# Patient Record
Sex: Female | Born: 1965 | Race: White | Hispanic: No | Marital: Single | Smoking: Former smoker
Health system: Southern US, Community
[De-identification: ages and names within clinical notes are randomized; demographics above are authoritative.]

## PROBLEM LIST (undated history)

## (undated) DIAGNOSIS — K219 Gastro-esophageal reflux disease without esophagitis: Secondary | ICD-10-CM

## (undated) DIAGNOSIS — T4145XA Adverse effect of unspecified anesthetic, initial encounter: Secondary | ICD-10-CM

## (undated) DIAGNOSIS — F329 Major depressive disorder, single episode, unspecified: Secondary | ICD-10-CM

## (undated) DIAGNOSIS — I10 Essential (primary) hypertension: Secondary | ICD-10-CM

## (undated) DIAGNOSIS — T8859XA Other complications of anesthesia, initial encounter: Secondary | ICD-10-CM

## (undated) DIAGNOSIS — F32A Depression, unspecified: Secondary | ICD-10-CM

## (undated) DIAGNOSIS — Z460 Encounter for fitting and adjustment of spectacles and contact lenses: Secondary | ICD-10-CM

## (undated) HISTORY — PX: COLONOSCOPY: SHX174

## (undated) HISTORY — PX: ORIF WRIST FRACTURE: SHX2133

## (undated) HISTORY — PX: FOOT NEUROMA SURGERY: SHX646

## (undated) HISTORY — PX: WISDOM TOOTH EXTRACTION: SHX21

## (undated) HISTORY — PX: APPENDECTOMY: SHX54

---

## 2007-07-31 HISTORY — PX: KNEE ARTHROSCOPY: SUR90

## 2008-07-30 HISTORY — PX: SHOULDER ARTHROSCOPY W/ ROTATOR CUFF REPAIR: SHX2400

## 2011-09-17 ENCOUNTER — Emergency Department (HOSPITAL_COMMUNITY)
Admission: EM | Admit: 2011-09-17 | Discharge: 2011-09-17 | Disposition: A | Discharge status: Home / Self Care | Payer: BC Managed Care – PPO | Attending: Emergency Medicine | Admitting: Emergency Medicine

## 2011-09-17 ENCOUNTER — Encounter (HOSPITAL_COMMUNITY): Payer: Self-pay | Admitting: *Deleted

## 2011-09-17 ENCOUNTER — Other Ambulatory Visit: Payer: Self-pay

## 2011-09-17 ENCOUNTER — Emergency Department (HOSPITAL_COMMUNITY): Payer: BC Managed Care – PPO

## 2011-09-17 DIAGNOSIS — Z79899 Other long term (current) drug therapy: Secondary | ICD-10-CM | POA: Insufficient documentation

## 2011-09-17 DIAGNOSIS — R55 Syncope and collapse: Secondary | ICD-10-CM | POA: Insufficient documentation

## 2011-09-17 DIAGNOSIS — I959 Hypotension, unspecified: Secondary | ICD-10-CM | POA: Insufficient documentation

## 2011-09-17 DIAGNOSIS — N390 Urinary tract infection, site not specified: Secondary | ICD-10-CM

## 2011-09-17 DIAGNOSIS — R079 Chest pain, unspecified: Secondary | ICD-10-CM | POA: Insufficient documentation

## 2011-09-17 DIAGNOSIS — K219 Gastro-esophageal reflux disease without esophagitis: Secondary | ICD-10-CM | POA: Insufficient documentation

## 2011-09-17 HISTORY — DX: Essential (primary) hypertension: I10

## 2011-09-17 HISTORY — DX: Gastro-esophageal reflux disease without esophagitis: K21.9

## 2011-09-17 HISTORY — DX: Depression, unspecified: F32.A

## 2011-09-17 HISTORY — DX: Major depressive disorder, single episode, unspecified: F32.9

## 2011-09-17 LAB — URINALYSIS, ROUTINE W REFLEX MICROSCOPIC
Ketones, ur: NEGATIVE mg/dL
Leukocytes, UA: NEGATIVE
Protein, ur: NEGATIVE mg/dL
Urobilinogen, UA: 0.2 mg/dL (ref 0.0–1.0)

## 2011-09-17 LAB — DIFFERENTIAL
Basophils Absolute: 0 10*3/uL (ref 0.0–0.1)
Basophils Relative: 0 % (ref 0–1)
Eosinophils Absolute: 0.2 10*3/uL (ref 0.0–0.7)
Eosinophils Relative: 1 % (ref 0–5)
Lymphs Abs: 1.6 10*3/uL (ref 0.7–4.0)
Neutrophils Relative %: 84 % — ABNORMAL HIGH (ref 43–77)

## 2011-09-17 LAB — POCT I-STAT, CHEM 8
Calcium, Ion: 1.18 mmol/L (ref 1.12–1.32)
Creatinine, Ser: 0.8 mg/dL (ref 0.50–1.10)
Glucose, Bld: 110 mg/dL — ABNORMAL HIGH (ref 70–99)
HCT: 35 % — ABNORMAL LOW (ref 36.0–46.0)
Hemoglobin: 11.9 g/dL — ABNORMAL LOW (ref 12.0–15.0)
Potassium: 3.9 mEq/L (ref 3.5–5.1)

## 2011-09-17 LAB — CBC
MCH: 27.6 pg (ref 26.0–34.0)
MCHC: 33 g/dL (ref 30.0–36.0)
MCV: 83.6 fL (ref 78.0–100.0)
Platelets: 367 10*3/uL (ref 150–400)
RBC: 4.02 MIL/uL (ref 3.87–5.11)
RDW: 13 % (ref 11.5–15.5)

## 2011-09-17 LAB — URINE MICROSCOPIC-ADD ON

## 2011-09-17 LAB — PREGNANCY, URINE: Preg Test, Ur: NEGATIVE

## 2011-09-17 MED ORDER — NITROFURANTOIN MONOHYD MACRO 100 MG PO CAPS: 100.0000 mg | ORAL_CAPSULE | Freq: Two times a day (BID) | ORAL | Status: AC

## 2011-09-17 MED ORDER — SODIUM CHLORIDE 0.9 % IV BOLUS (SEPSIS)
1000.0000 mL | Freq: Once | INTRAVENOUS | Status: AC
  Administered 2011-09-17: 1000 mL via INTRAVENOUS

## 2011-09-17 NOTE — ED Notes (Signed)
MD at bedside. 

## 2011-09-17 NOTE — ED Notes (Signed)
Pt ambulates to restroom without difficulty. Denies dizziness at this time

## 2011-09-17 NOTE — ED Notes (Signed)
Per ems, pt received 324mg  asa enroute

## 2011-09-17 NOTE — Discharge Instructions (Signed)
Near-Syncope Near-syncope is sudden weakness, dizziness, or feeling like you might pass out (faint). This may occur when getting up after sitting or while standing for a long period of time. Near-syncope can be caused by a drop in blood pressure. This is a common reaction, but it may occur to a greater degree in people taking medicines to control their blood pressure. Fainting often occurs when the blood pressure or pulse is too low to provide enough blood flow to the brain to keep you conscious. Fainting and near-syncope are not usually due to serious medical problems. However, certain people should be more cautious in the event of near-syncope, including elderly patients, patients with diabetes, and patients with a history of heart conditions (especially irregular rhythms).  CAUSES   Drop in blood pressure.   Physical pain.   Dehydration.   Heat exhaustion.   Emotional distress.   Low blood sugar.   Internal bleeding.   Heart and circulatory problems.   Infections.  SYMPTOMS   Dizziness.   Feeling sick to your stomach (nauseous).   Nearly fainting.   Body numbness.   Turning pale.   Tunnel vision.   Weakness.  HOME CARE INSTRUCTIONS   Lie down right away if you start feeling like you might faint. Breathe deeply and steadily. Wait until all the symptoms have passed. Most of these episodes last only a few minutes. You may feel tired for several hours.   Drink enough fluids to keep your urine clear or pale yellow.   If you are taking blood pressure or heart medicine, get up slowly, taking several minutes to sit and then stand. This can reduce dizziness that is caused by a drop in blood pressure.  SEEK IMMEDIATE MEDICAL CARE IF:   You have a severe headache.   Unusual pain develops in the chest, abdomen, or back.   There is bleeding from the mouth or rectum, or you have black or tarry stool.   An irregular heartbeat or a very rapid pulse develops.   You have  repeated fainting or seizure-like jerking during an episode.   You faint when sitting or lying down.   You develop confusion.   You have difficulty walking.   Severe weakness develops.   Vision problems develop.  MAKE SURE YOU:   Understand these instructions.   Will watch your condition.   Will get help right away if you are not doing well or get worse.  Document Released: 07/16/2005 Document Revised: 03/28/2011 Document Reviewed: 09/01/2010 [facility] Patient Information 2012 [place], [place].Urinary Tract Infection Infections of the urinary tract can start in several places. A bladder infection (cystitis), a kidney infection (pyelonephritis), and a prostate infection (prostatitis) are different types of urinary tract infections (UTIs). They usually get better if treated with medicines (antibiotics) that kill germs. Take all the medicine until it is gone. You or your child may feel better in a few days, but TAKE ALL MEDICINE or the infection may not respond and may become more difficult to treat. HOME CARE INSTRUCTIONS   Drink enough water and fluids to keep the urine clear or pale yellow. Cranberry juice is especially recommended, in addition to large amounts of water.   Avoid caffeine, tea, and carbonated beverages. They tend to irritate the bladder.   Alcohol may irritate the prostate.   Only take over-the-counter or prescription medicines for pain, discomfort, or fever as directed by your caregiver.  To prevent further infections:  Empty the bladder often. Avoid holding urine for long  periods of time.   After a bowel movement, women should cleanse from front to back. Use each tissue only once.   Empty the bladder before and after sexual intercourse.  FINDING OUT THE RESULTS OF YOUR TEST Not all test results are available during your visit. If your or your child's test results are not back during the visit, make an appointment with your caregiver to find out the results. Do  not assume everything is normal if you have not heard from your caregiver or the medical facility. It is important for you to follow up on all test results. SEEK MEDICAL CARE IF:   There is back pain.   Your baby is older than 3 months with a rectal temperature of 100.5 F (38.1 C) or higher for more than 1 day.   Your or your child's problems (symptoms) are no better in 3 days. Return sooner if you or your child is getting worse.  SEEK IMMEDIATE MEDICAL CARE IF:   There is severe back pain or lower abdominal pain.   You or your child develops chills.   You have a fever.   Your baby is older than 3 months with a rectal temperature of 102 F (38.9 C) or higher.   Your baby is 68 months old or younger with a rectal temperature of 100.4 F (38 C) or higher.   There is nausea or vomiting.   There is continued burning or discomfort with urination.  MAKE SURE YOU:   Understand these instructions.   Will watch your condition.   Will get help right away if you are not doing well or get worse.  Document Released: 04/25/2005 Document Revised: 03/28/2011 Document Reviewed: 11/28/2006 [facility] Patient Information 2012 [place], [place].

## 2011-09-17 NOTE — ED Notes (Addendum)
Pt arrives via ambulance. Pt began feeling dizzy and nauseous. Laid flat and began experiencing chest pressure and tingling in left arm. Per ems, pt hypotensive with weak peripheral pulses enroute enroute.

## 2011-09-17 NOTE — ED Provider Notes (Signed)
History     CSN: [id number]  Arrival date & time 09/17/11  1236   First MD Initiated Contact with Patient 09/17/11 1243      Chief Complaint  Patient presents with  . Hypotension  . Dizziness  . Chest Pain    (Consider location/radiation/quality/duration/timing/severity/associated sxs/prior treatment) Patient is a 46 y.o. female presenting with chest pain. The history is provided by the patient.  Chest Pain Primary symptoms include nausea and dizziness. Pertinent negatives for primary symptoms include no shortness of breath, no abdominal pain and no vomiting.  Dizziness also occurs with nausea. Dizziness does not occur with vomiting or weakness.  Pertinent negatives for associated symptoms include no numbness and no weakness.    patient states that she was sitting at work at her desk and began to feel lightheaded. She states that she felt like she was nauseous and that she was going to pass out. She is a little bit of chest pressure at the time. She went to the floor sit down indication fall. She still feels a little weak. She's reportedly hypotensive for EMS. She's been doing well the last few days. Patient feels somewhat better now. She states she's currently on her period no actual vomiting. She is a former smoker. She has no known cardiac history  Past Medical History  Diagnosis Date  . Hypertension   . GERD (gastroesophageal reflux disease)   . Depression     Past Surgical History  Procedure Date  . Appendectomy   . Other surgical history     right shoulder, left wrist, right knee surgeries    No family history on file.  History  Substance Use Topics  . Smoking status: Former Smoker    Quit date: 07/30/2005  . Smokeless tobacco: Not on file  . Alcohol Use: Yes    OB History    Grav Para Term Preterm Abortions TAB SAB Ect Mult Living                  Review of Systems  Constitutional: Negative for activity change and appetite change.  HENT: Negative for  neck stiffness.   Eyes: Negative for pain.  Respiratory: Negative for chest tightness and shortness of breath.   Cardiovascular: Positive for chest pain. Negative for leg swelling.  Gastrointestinal: Positive for nausea. Negative for vomiting, abdominal pain and diarrhea.  Genitourinary: Negative for flank pain.  Musculoskeletal: Negative for back pain.  Skin: Negative for rash.  Neurological: Positive for dizziness and light-headedness. Negative for weakness, numbness and headaches.  Psychiatric/Behavioral: Negative for behavioral problems.    Allergies  Penicillins and Sulfa antibiotics  Home Medications   Current Outpatient Rx  Name Route Sig Dispense Refill  . CITALOPRAM HYDROBROMIDE 20 MG PO TABS Oral Take 20 mg by mouth daily.    Marland Kitchen HYDROCODONE-ACETAMINOPHEN 10-500 MG PO TABS Oral Take 1 tablet by mouth every 8 (eight) hours as needed. For pain    . LOSARTAN POTASSIUM 100 MG PO TABS Oral Take 100 mg by mouth daily.    Marland Kitchen OMEPRAZOLE 20 MG PO CPDR Oral Take 20 mg by mouth daily.    . SUMATRIPTAN SUCCINATE 100 MG PO TABS Oral Take 100 mg by mouth every 2 (two) hours as needed. For migraines    . NITROFURANTOIN MONOHYD MACRO 100 MG PO CAPS Oral Take 1 capsule (100 mg total) by mouth 2 (two) times daily. 10 capsule 0    BP 124/73  Pulse 69  Temp(Src) 98.1 F (36.7 C) (Oral)  Resp 19  Ht 5\' 7"  (1.702 m)  Wt 210 lb (95.255 kg)  BMI 32.89 kg/m2  SpO2 100%  LMP 09/14/2011  Physical Exam  Nursing note and vitals reviewed. Constitutional: She is oriented to person, place, and time. She appears well-developed and well-nourished.  HENT:  Head: Normocephalic and atraumatic.  Eyes: EOM are normal. Pupils are equal, round, and reactive to light.  Neck: Normal range of motion. Neck supple.  Cardiovascular: Normal rate, regular rhythm and normal heart sounds.   No murmur heard. Pulmonary/Chest: Effort normal and breath sounds normal. No respiratory distress. She has no wheezes. She  has no rales.  Abdominal: Soft. Bowel sounds are normal. She exhibits no distension. There is no tenderness. There is no rebound and no guarding.  Musculoskeletal: Normal range of motion.  Neurological: She is alert and oriented to person, place, and time. No cranial nerve deficit.  Skin: Skin is warm.       Patient's lips look somewhat pale   Psychiatric: She has a normal mood and affect. Her speech is normal.    ED Course  Procedures (including critical care time)  Labs Reviewed  CBC - Abnormal; Notable for the following:    WBC 14.4 (*)    Hemoglobin 11.1 (*)    HCT 33.6 (*)    All other components within normal limits  DIFFERENTIAL - Abnormal; Notable for the following:    Neutrophils Relative 84 (*)    Neutro Abs 12.1 (*)    Lymphocytes Relative 11 (*)    All other components within normal limits  URINALYSIS, ROUTINE W REFLEX MICROSCOPIC - Abnormal; Notable for the following:    Hgb urine dipstick LARGE (*)    All other components within normal limits  POCT I-STAT, CHEM 8 - Abnormal; Notable for the following:    Glucose, Bld 110 (*)    Hemoglobin 11.9 (*)    HCT 35.0 (*)    All other components within normal limits  URINE MICROSCOPIC-ADD ON - Abnormal; Notable for the following:    Squamous Epithelial / LPF MANY (*)    Bacteria, UA FEW (*)    All other components within normal limits  PREGNANCY, URINE  TROPONIN I   Dg Chest 2 View  09/17/2011  *RADIOLOGY REPORT*  Clinical Data: Syncope, shortness of breath.  CHEST - 2 VIEW  Comparison: None.  Findings: Trachea is midline.  Heart size normal.  Lungs are clear. No pleural fluid.  IMPRESSION: No acute findings.  Original Report Authenticated By: Reyes Ivan, M.D.     1. Near syncope   2. UTI (urinary tract infection)      Date: 09/17/2011  Rate: 62  Rhythm: normal sinus rhythm  QRS Axis: normal  Intervals: normal  ST/T Wave abnormalities: normal  Conduction Disutrbances:none  Narrative Interpretation:     Old EKG Reviewed: none available    MDM  Near syncope. Reportedly hypotensive for EMS. Patient feels much better now. He had some chest pain at times, but her EKG is reassuring. She will followup with cardiology as needed. She has a white count of 14 a possible urinary tract infection. She will be treated        Juliet Rude. Rubin Payor, MD 09/17/11 1453

## 2013-07-20 DIAGNOSIS — R51 Headache: Secondary | ICD-10-CM

## 2013-07-20 DIAGNOSIS — F329 Major depressive disorder, single episode, unspecified: Secondary | ICD-10-CM | POA: Insufficient documentation

## 2013-07-20 DIAGNOSIS — I10 Essential (primary) hypertension: Secondary | ICD-10-CM | POA: Insufficient documentation

## 2013-07-20 DIAGNOSIS — F32A Depression, unspecified: Secondary | ICD-10-CM | POA: Insufficient documentation

## 2013-07-20 DIAGNOSIS — E348 Other specified endocrine disorders: Secondary | ICD-10-CM | POA: Insufficient documentation

## 2013-07-20 DIAGNOSIS — G8929 Other chronic pain: Secondary | ICD-10-CM | POA: Insufficient documentation

## 2013-12-28 ENCOUNTER — Encounter (HOSPITAL_BASED_OUTPATIENT_CLINIC_OR_DEPARTMENT_OTHER)
Admission: RE | Admit: 2013-12-28 | Discharge: 2013-12-28 | Disposition: A | Discharge status: Home / Self Care | Payer: BC Managed Care – PPO | Source: Ambulatory Visit | Attending: Orthopedic Surgery | Admitting: Orthopedic Surgery

## 2013-12-28 ENCOUNTER — Encounter (HOSPITAL_BASED_OUTPATIENT_CLINIC_OR_DEPARTMENT_OTHER): Payer: Self-pay | Admitting: *Deleted

## 2013-12-28 ENCOUNTER — Other Ambulatory Visit: Payer: Self-pay | Admitting: Orthopedic Surgery

## 2013-12-28 LAB — BASIC METABOLIC PANEL
BUN: 12 mg/dL (ref 6–23)
CALCIUM: 8.6 mg/dL (ref 8.4–10.5)
CO2: 25 meq/L (ref 19–32)
CREATININE: 0.79 mg/dL (ref 0.50–1.10)
Chloride: 103 mEq/L (ref 96–112)
GFR calc Af Amer: >90 mL/min (ref >90)
GFR calc non Af Amer: >90 mL/min (ref >90)
Glucose, Bld: 82 mg/dL (ref 70–99)
Potassium: 4.3 mEq/L (ref 3.7–5.3)
Sodium: 139 mEq/L (ref 137–147)

## 2013-12-28 NOTE — Progress Notes (Signed)
To come in for [id number]  

## 2013-12-30 ENCOUNTER — Encounter (HOSPITAL_BASED_OUTPATIENT_CLINIC_OR_DEPARTMENT_OTHER): Payer: BC Managed Care – PPO | Admitting: Certified Registered"

## 2013-12-30 ENCOUNTER — Encounter (HOSPITAL_BASED_OUTPATIENT_CLINIC_OR_DEPARTMENT_OTHER): Payer: Self-pay

## 2013-12-30 ENCOUNTER — Encounter (HOSPITAL_BASED_OUTPATIENT_CLINIC_OR_DEPARTMENT_OTHER)
Admission: RE | Disposition: A | Discharge status: Home / Self Care | Payer: Self-pay | Source: Ambulatory Visit | Attending: Orthopedic Surgery

## 2013-12-30 ENCOUNTER — Ambulatory Visit (HOSPITAL_BASED_OUTPATIENT_CLINIC_OR_DEPARTMENT_OTHER)
Admission: RE | Admit: 2013-12-30 | Discharge: 2013-12-30 | Disposition: A | Discharge status: Home / Self Care | Payer: BC Managed Care – PPO | Source: Ambulatory Visit | Attending: Orthopedic Surgery | Admitting: Orthopedic Surgery

## 2013-12-30 ENCOUNTER — Ambulatory Visit (HOSPITAL_BASED_OUTPATIENT_CLINIC_OR_DEPARTMENT_OTHER): Payer: BC Managed Care – PPO | Admitting: Certified Registered"

## 2013-12-30 DIAGNOSIS — M629 Disorder of muscle, unspecified: Secondary | ICD-10-CM | POA: Insufficient documentation

## 2013-12-30 DIAGNOSIS — K219 Gastro-esophageal reflux disease without esophagitis: Secondary | ICD-10-CM | POA: Insufficient documentation

## 2013-12-30 DIAGNOSIS — M7061 Trochanteric bursitis, right hip: Secondary | ICD-10-CM

## 2013-12-30 DIAGNOSIS — I10 Essential (primary) hypertension: Secondary | ICD-10-CM | POA: Insufficient documentation

## 2013-12-30 DIAGNOSIS — Z79899 Other long term (current) drug therapy: Secondary | ICD-10-CM | POA: Insufficient documentation

## 2013-12-30 DIAGNOSIS — M2420 Disorder of ligament, unspecified site: Secondary | ICD-10-CM | POA: Insufficient documentation

## 2013-12-30 DIAGNOSIS — F329 Major depressive disorder, single episode, unspecified: Secondary | ICD-10-CM | POA: Insufficient documentation

## 2013-12-30 DIAGNOSIS — M76899 Other specified enthesopathies of unspecified lower limb, excluding foot: Secondary | ICD-10-CM | POA: Insufficient documentation

## 2013-12-30 DIAGNOSIS — F3289 Other specified depressive episodes: Secondary | ICD-10-CM | POA: Insufficient documentation

## 2013-12-30 DIAGNOSIS — Z87891 Personal history of nicotine dependence: Secondary | ICD-10-CM | POA: Insufficient documentation

## 2013-12-30 HISTORY — PX: EXCISION/RELEASE BURSA HIP: SHX5014

## 2013-12-30 HISTORY — PX: ILIOTIBIAL BAND RELEASE: SHX675

## 2013-12-30 HISTORY — DX: Other complications of anesthesia, initial encounter: T88.59XA

## 2013-12-30 HISTORY — DX: Adverse effect of unspecified anesthetic, initial encounter: T41.45XA

## 2013-12-30 HISTORY — DX: Encounter for fitting and adjustment of spectacles and contact lenses: Z46.0

## 2013-12-30 LAB — POCT HEMOGLOBIN-HEMACUE: Hemoglobin: 12.5 g/dL (ref 12.0–15.0)

## 2013-12-30 SURGERY — RELEASE, BURSA, TROCHANTERIC
Anesthesia: General | Laterality: Right

## 2013-12-30 MED ORDER — PROPOFOL 10 MG/ML IV EMUL
INTRAVENOUS | Status: AC
  Filled 2013-12-30: qty 50

## 2013-12-30 MED ORDER — OXYCODONE HCL 5 MG PO TABS
ORAL_TABLET | ORAL | Status: AC
  Filled 2013-12-30: qty 1

## 2013-12-30 MED ORDER — ONDANSETRON HCL 4 MG/2ML IJ SOLN
INTRAMUSCULAR | Status: DC | PRN
  Administered 2013-12-30: 4 mg via INTRAVENOUS

## 2013-12-30 MED ORDER — MIDAZOLAM HCL 2 MG/2ML IJ SOLN
INTRAMUSCULAR | Status: AC
  Filled 2013-12-30: qty 2

## 2013-12-30 MED ORDER — OXYCODONE-ACETAMINOPHEN 5-325 MG PO TABS: 1.0000 | ORAL_TABLET | Freq: Four times a day (QID) | ORAL | Status: DC | PRN

## 2013-12-30 MED ORDER — LACTATED RINGERS IV SOLN
INTRAVENOUS | Status: DC
Start: 2013-12-30 — End: 2013-12-30
  Administered 2013-12-30 (×2): via INTRAVENOUS

## 2013-12-30 MED ORDER — PROPOFOL 10 MG/ML IV BOLUS
INTRAVENOUS | Status: DC | PRN
  Administered 2013-12-30: 160 mg via INTRAVENOUS

## 2013-12-30 MED ORDER — OXYCODONE HCL 5 MG/5ML PO SOLN: 5.0000 mg | Freq: Once | ORAL | Status: AC | PRN

## 2013-12-30 MED ORDER — BUPIVACAINE-EPINEPHRINE 0.5% -1:200000 IJ SOLN
INTRAMUSCULAR | Status: DC | PRN
  Administered 2013-12-30: 30 mL

## 2013-12-30 MED ORDER — METOCLOPRAMIDE HCL 5 MG/ML IJ SOLN: 10.0000 mg | Freq: Once | INTRAMUSCULAR | Status: DC | PRN

## 2013-12-30 MED ORDER — FENTANYL CITRATE 0.05 MG/ML IJ SOLN: 50.0000 ug | INTRAMUSCULAR | Status: DC | PRN

## 2013-12-30 MED ORDER — BUPIVACAINE-EPINEPHRINE (PF) 0.5% -1:200000 IJ SOLN
INTRAMUSCULAR | Status: AC
  Filled 2013-12-30: qty 30

## 2013-12-30 MED ORDER — SUCCINYLCHOLINE CHLORIDE 20 MG/ML IJ SOLN
INTRAMUSCULAR | Status: AC
  Filled 2013-12-30: qty 1

## 2013-12-30 MED ORDER — LIDOCAINE HCL (PF) 1 % IJ SOLN
INTRAMUSCULAR | Status: AC
  Filled 2013-12-30: qty 30

## 2013-12-30 MED ORDER — OXYCODONE HCL 5 MG PO TABS
5.0000 mg | ORAL_TABLET | Freq: Once | ORAL | Status: AC | PRN
  Administered 2013-12-30: 5 mg via ORAL

## 2013-12-30 MED ORDER — BUPIVACAINE HCL (PF) 0.5 % IJ SOLN
INTRAMUSCULAR | Status: AC
  Filled 2013-12-30: qty 30

## 2013-12-30 MED ORDER — MIDAZOLAM HCL 2 MG/2ML IJ SOLN: 1.0000 mg | INTRAMUSCULAR | Status: DC | PRN

## 2013-12-30 MED ORDER — DEXAMETHASONE SODIUM PHOSPHATE 4 MG/ML IJ SOLN
INTRAMUSCULAR | Status: DC | PRN
  Administered 2013-12-30: 10 mg via INTRAVENOUS

## 2013-12-30 MED ORDER — LIDOCAINE HCL (CARDIAC) 20 MG/ML IV SOLN
INTRAVENOUS | Status: DC | PRN
  Administered 2013-12-30: 40 mg via INTRAVENOUS

## 2013-12-30 MED ORDER — KETOROLAC TROMETHAMINE 30 MG/ML IJ SOLN
INTRAMUSCULAR | Status: DC | PRN
  Administered 2013-12-30: 30 mg via INTRAVENOUS

## 2013-12-30 MED ORDER — ATROPINE SULFATE 0.4 MG/ML IJ SOLN
INTRAMUSCULAR | Status: DC | PRN
  Administered 2013-12-30: 0.4 mg via INTRAVENOUS

## 2013-12-30 MED ORDER — EPHEDRINE SULFATE 50 MG/ML IJ SOLN
INTRAMUSCULAR | Status: DC | PRN
  Administered 2013-12-30: 10 mg via INTRAVENOUS

## 2013-12-30 MED ORDER — METOCLOPRAMIDE HCL 5 MG/ML IJ SOLN
INTRAMUSCULAR | Status: DC | PRN
  Administered 2013-12-30: 10 mg via INTRAVENOUS

## 2013-12-30 MED ORDER — HYDROMORPHONE HCL PF 1 MG/ML IJ SOLN
INTRAMUSCULAR | Status: AC
  Filled 2013-12-30: qty 1

## 2013-12-30 MED ORDER — LIDOCAINE HCL 2 % IJ SOLN
INTRAMUSCULAR | Status: AC
  Filled 2013-12-30: qty 20

## 2013-12-30 MED ORDER — FENTANYL CITRATE 0.05 MG/ML IJ SOLN
INTRAMUSCULAR | Status: DC | PRN
  Administered 2013-12-30: 100 ug via INTRAVENOUS
  Administered 2013-12-30: 50 ug via INTRAVENOUS

## 2013-12-30 MED ORDER — CLINDAMYCIN PHOSPHATE 900 MG/50ML IV SOLN
INTRAVENOUS | Status: AC
  Filled 2013-12-30: qty 50

## 2013-12-30 MED ORDER — CLINDAMYCIN PHOSPHATE 900 MG/50ML IV SOLN: 900.0000 mg | INTRAVENOUS | Status: DC

## 2013-12-30 MED ORDER — CHLORHEXIDINE GLUCONATE 4 % EX LIQD: 60.0000 mL | Freq: Once | CUTANEOUS | Status: DC

## 2013-12-30 MED ORDER — FENTANYL CITRATE 0.05 MG/ML IJ SOLN
INTRAMUSCULAR | Status: AC
  Filled 2013-12-30: qty 6

## 2013-12-30 MED ORDER — CLINDAMYCIN PHOSPHATE 900 MG/50ML IV SOLN
INTRAVENOUS | Status: DC | PRN
  Administered 2013-12-30: 900 mg via INTRAVENOUS

## 2013-12-30 MED ORDER — HYDROMORPHONE HCL PF 1 MG/ML IJ SOLN
0.2500 mg | INTRAMUSCULAR | Status: DC | PRN
  Administered 2013-12-30: 0.5 mg via INTRAVENOUS

## 2013-12-30 SURGICAL SUPPLY — 55 items
BENZOIN TINCTURE PRP APPL 2/3 (GAUZE/BANDAGES/DRESSINGS) ×4 IMPLANT
BLADE 10 SAFETY STRL DISP (BLADE) IMPLANT
BLADE 15 SAFETY STRL DISP (BLADE) ×2 IMPLANT
CANISTER SUCT 1200ML W/VALVE (MISCELLANEOUS) ×2 IMPLANT
CHLORAPREP W/TINT 26ML (MISCELLANEOUS) ×2 IMPLANT
CLEANER CAUTERY TIP 5X5 PAD (MISCELLANEOUS) IMPLANT
COVER MAYO STAND STRL (DRAPES) IMPLANT
COVER TABLE BACK 60X90 (DRAPES) ×2 IMPLANT
DECANTER SPIKE VIAL GLASS SM (MISCELLANEOUS) IMPLANT
DRAPE INCISE IOBAN 66X45 STRL (DRAPES) ×2 IMPLANT
DRAPE LAPAROTOMY T 102X78X121 (DRAPES) IMPLANT
DRAPE U-SHAPE 47X51 STRL (DRAPES) ×4 IMPLANT
DRSG ADAPTIC 3X8 NADH LF (GAUZE/BANDAGES/DRESSINGS) ×2 IMPLANT
DRSG EMULSION OIL 3X3 NADH (GAUZE/BANDAGES/DRESSINGS) IMPLANT
DRSG PAD ABDOMINAL 8X10 ST (GAUZE/BANDAGES/DRESSINGS) ×2 IMPLANT
DURAPREP 26ML APPLICATOR (WOUND CARE) IMPLANT
ELECT REM PT RETURN 9FT ADLT (ELECTROSURGICAL) ×2
ELECTRODE REM PT RTRN 9FT ADLT (ELECTROSURGICAL) ×1 IMPLANT
GAUZE SPONGE 4X4 12PLY STRL (GAUZE/BANDAGES/DRESSINGS) IMPLANT
GLOVE BIOGEL PI IND STRL 7.0 (GLOVE) ×1 IMPLANT
GLOVE BIOGEL PI IND STRL 8 (GLOVE) ×2 IMPLANT
GLOVE BIOGEL PI INDICATOR 7.0 (GLOVE) ×1
GLOVE BIOGEL PI INDICATOR 8 (GLOVE) ×2
GLOVE ECLIPSE 6.5 STRL STRAW (GLOVE) ×2 IMPLANT
GLOVE ECLIPSE 7.5 STRL STRAW (GLOVE) ×6 IMPLANT
GLOVE EXAM NITRILE LRG STRL (GLOVE) ×2 IMPLANT
GOWN STRL REUS W/ TWL LRG LVL3 (GOWN DISPOSABLE) ×1 IMPLANT
GOWN STRL REUS W/ TWL XL LVL3 (GOWN DISPOSABLE) ×1 IMPLANT
GOWN STRL REUS W/TWL LRG LVL3 (GOWN DISPOSABLE) ×1
GOWN STRL REUS W/TWL XL LVL3 (GOWN DISPOSABLE) ×3 IMPLANT
NEEDLE HYPO 22GX1.5 SAFETY (NEEDLE) IMPLANT
NS IRRIG 1000ML POUR BTL (IV SOLUTION) ×2 IMPLANT
PACK ARTHROSCOPY DSU (CUSTOM PROCEDURE TRAY) ×2 IMPLANT
PACK BASIN DAY SURGERY FS (CUSTOM PROCEDURE TRAY) ×2 IMPLANT
PAD CLEANER CAUTERY TIP 5X5 (MISCELLANEOUS)
PENCIL BUTTON HOLSTER BLD 10FT (ELECTRODE) ×2 IMPLANT
SPONGE GAUZE 4X4 12PLY STER LF (GAUZE/BANDAGES/DRESSINGS) ×2 IMPLANT
SPONGE LAP 18X18 X RAY DECT (DISPOSABLE) ×2 IMPLANT
STAPLER VISISTAT 35W (STAPLE) IMPLANT
STRIP CLOSURE SKIN 1/2X4 (GAUZE/BANDAGES/DRESSINGS) ×2 IMPLANT
SUT BONE WAX W31G (SUTURE) ×2 IMPLANT
SUT ETHILON 4 0 PS 2 18 (SUTURE) IMPLANT
SUT MNCRL AB 3-0 PS2 18 (SUTURE) ×2 IMPLANT
SUT VIC AB 0 CT1 27 (SUTURE) ×1
SUT VIC AB 0 CT1 27XBRD ANBCTR (SUTURE) ×1 IMPLANT
SUT VIC AB 1 CT1 27 (SUTURE) ×1
SUT VIC AB 1 CT1 27XBRD ANBCTR (SUTURE) ×1 IMPLANT
SUT VIC AB 2-0 SH 27 (SUTURE) ×1
SUT VIC AB 2-0 SH 27XBRD (SUTURE) ×1 IMPLANT
SYR BULB 3OZ (MISCELLANEOUS) ×2 IMPLANT
SYR CONTROL 10ML LL (SYRINGE) IMPLANT
TOWEL OR NON WOVEN STRL DISP B (DISPOSABLE) ×2 IMPLANT
TUBE CONNECTING 20X1/4 (TUBING) ×2 IMPLANT
UNDERPAD 30X30 INCONTINENT (UNDERPADS AND DIAPERS) IMPLANT
YANKAUER SUCT BULB TIP NO VENT (SUCTIONS) ×2 IMPLANT

## 2013-12-30 NOTE — H&P (Signed)
PREOPERATIVE H&P  Chief Complaint: right hip pain  HPI: Stephanie Moon is a 48 y.o. female who presents for evaluation of right hip pain. It has been present for greater than a year and has been worsening. She has failed conservative measuresincluding physical therapy, injection therapy, and activity modification.. Pain is rated as moderate.  Past Medical History  Diagnosis Date  . Hypertension   . GERD (gastroesophageal reflux disease)   . Depression   . Contact lens/glasses fitting     wears contacts or glasses  . Complication of anesthesia     slow to wake up-o2 sat drop   Past Surgical History  Procedure Laterality Date  . Appendectomy    . Shoulder arthroscopy w/ rotator cuff repair  2010    right  . Knee arthroscopy  2009    right  . Foot neuroma surgery    . Orif wrist fracture  2001,2002    left  . Colonoscopy    . Wisdom tooth extraction     History   Social History  . Marital Status: Single    Spouse Name: N/A    Number of Children: N/A  . Years of Education: N/A   Social History Main Topics  . Smoking status: Former Smoker    Quit date: 07/30/2005  . Smokeless tobacco: None  . Alcohol Use: Yes  . Drug Use: No  . Sexual Activity: None   Other Topics Concern  . None   Social History Narrative  . None   History reviewed. No pertinent family history. Allergies  Allergen Reactions  . Penicillins Rash  . Sulfa Antibiotics Rash   Prior to Admission medications   Medication Sig Start Date End Date Taking? Authorizing Provider  citalopram (CELEXA) 20 MG tablet Take 20 mg by mouth daily.   Yes Historical Provider, MD  losartan (COZAAR) 100 MG tablet Take 100 mg by mouth daily.   Yes Historical Provider, MD  omeprazole (PRILOSEC) 20 MG capsule Take 20 mg by mouth daily.   Yes Historical Provider, MD  SUMAtriptan (IMITREX) 100 MG tablet Take 100 mg by mouth every 2 (two) hours as needed. For migraines   Yes Historical Provider, MD     Positive ROS:  none  All other systems have been reviewed and were otherwise negative with the exception of those mentioned in the HPI and as above.  Physical Exam: Filed Vitals:   12/30/13 0705  BP: 130/88  Pulse: 74  Temp: 98.5 F (36.9 C)  Resp: 20    General: Alert, no acute distress Cardiovascular: No pedal edema Respiratory: No cyanosis, no use of accessory musculature GI: No organomegaly, abdomen is soft and non-tender Skin: No lesions in the area of chief complaint Neurologic: Sensation intact distally Psychiatric: Patient is competent for consent with normal mood and affect Lymphatic: No axillary or cervical lymphadenopathy  MUSCULOSKELETAL: right hip: Markedly tender to palpation over the greater trochanter.  Pain with abduction.  Pain with rotation. X-rays show no evidence of arthritis.  MRI shows no evidence of ligament tear or avascular necrosis. Assessment/Plan: trochanteric bursitis, tight ILIAL-TIBIAL BAND  Plan for Procedure(s): EXCISION/RELEASE BURSA HIP ILIO-TIBIAL BAND RELEASE  The risks benefits and alternatives were discussed with the patient including but not limited to the risks of nonoperative treatment, versus surgical intervention including infection, bleeding, nerve injury, malunion, nonunion, hardware prominence, hardware failure, need for hardware removal, blood clots, cardiopulmonary complications, morbidity, mortality, among others, and they were willing to proceed.  Predicted outcome is good, although  there will be at least a six to nine month expected recovery.  Harvie Junior, MD 12/30/2013 8:11 AM

## 2013-12-30 NOTE — Anesthesia Postprocedure Evaluation (Signed)
Anesthesia Post Note  Patient: Stephanie Moon  Procedure(s) Performed: Procedure(s) (LRB): EXCISION/RELEASE BURSA HIP (Right) ILIO-TIBIAL BAND RELEASE (Right)  Anesthesia type: General  Patient location: PACU  Post pain: Pain level controlled  Post assessment: Patient's Cardiovascular Status Stable  Last Vitals:  Filed Vitals:   12/30/13 1053  BP:   Pulse: 90  Temp:   Resp: 18    Post vital signs: Reviewed and stable  Level of consciousness: alert  Complications: No apparent anesthesia complications

## 2013-12-30 NOTE — Anesthesia Preprocedure Evaluation (Signed)
Anesthesia Evaluation  Patient identified by MRN, date of birth, ID band Patient awake    Reviewed: Allergy & Precautions, H&P , NPO status , Patient's Chart, lab work & pertinent test results, reviewed documented beta blocker date and time   History of Anesthesia Complications (+) PROLONGED EMERGENCE and history of anesthetic complications  Airway Mallampati: II TM Distance: >3 FB Neck ROM: full    Dental   Pulmonary neg pulmonary ROS, former smoker,  breath sounds clear to auscultation        Cardiovascular hypertension, On Medications Rhythm:regular     Neuro/Psych PSYCHIATRIC DISORDERS negative neurological ROS     GI/Hepatic Neg liver ROS, GERD-  Medicated and Controlled,  Endo/Other  negative endocrine ROS  Renal/GU negative Renal ROS  negative genitourinary   Musculoskeletal   Abdominal   Peds  Hematology negative hematology ROS (+)   Anesthesia Other Findings See surgeon's H&P   Reproductive/Obstetrics negative OB ROS                           Anesthesia Physical Anesthesia Plan  ASA: II  Anesthesia Plan: General   Post-op Pain Management:    Induction: Intravenous  Airway Management Planned: LMA and Oral ETT  Additional Equipment:   Intra-op Plan:   Post-operative Plan: Extubation in OR  Informed Consent: I have reviewed the patients History and Physical, chart, labs and discussed the procedure including the risks, benefits and alternatives for the proposed anesthesia with the patient or authorized representative who has indicated his/her understanding and acceptance.   Dental Advisory Given  Plan Discussed with: CRNA and Surgeon  Anesthesia Plan Comments:         Anesthesia Quick Evaluation

## 2013-12-30 NOTE — Discharge Instructions (Signed)
Ambulate Wgt Bearing as tolerated on your right leg.  Use crutches as needed for a few days. Al Apply ice to hip x 3-4 days.    Post Anesthesia Home Care Instructions  Activity: Get plenty of rest for the remainder of the day. A responsible adult should stay with you for 24 hours following the procedure.  For the next 24 hours, DO NOT: -Drive a car -Advertising copywriter -Drink alcoholic beverages -Take any medication unless instructed by your physician -Make any legal decisions or sign important papers.  Meals: Start with liquid foods such as gelatin or soup. Progress to regular foods as tolerated. Avoid greasy, spicy, heavy foods. If nausea and/or vomiting occur, drink only clear liquids until the nausea and/or vomiting subsides. Call your physician if vomiting continues.  Special Instructions/Symptoms: Your throat may feel dry or sore from the anesthesia or the breathing tube placed in your throat during surgery. If this causes discomfort, gargle with warm salt water. The discomfort should disappear within 24 hours.   Call your surgeon if you experience:   1.  Fever over 101.0. 2.  Inability to urinate. 3.  Nausea and/or vomiting. 4.  Extreme swelling or bruising at the surgical site. 5.  Continued bleeding from the incision. 6.  Increased pain, redness or drainage from the incision. 7.  Problems related to your pain medication.

## 2013-12-30 NOTE — Anesthesia Procedure Notes (Signed)
Procedure Name: Intubation Date/Time: 12/30/2013 8:47 AM Performed by: Curly Shores Pre-anesthesia Checklist: Patient identified, Emergency Drugs available, Suction available and Patient being monitored Patient Re-evaluated:Patient Re-evaluated prior to inductionOxygen Delivery Method: Circle System Utilized Preoxygenation: Pre-oxygenation with 100% oxygen Intubation Type: IV induction Ventilation: Mask ventilation without difficulty Laryngoscope Size: Miller and 2 Grade View: Grade I Tube type: Oral Tube size: 7.0 mm Number of attempts: 1 Airway Equipment and Method: stylet Placement Confirmation: ETT inserted through vocal cords under direct vision,  positive ETCO2 and breath sounds checked- equal and bilateral Secured at: 21 cm Tube secured with: Tape Dental Injury: Teeth and Oropharynx as per pre-operative assessment

## 2013-12-30 NOTE — Transfer of Care (Signed)
Immediate Anesthesia Transfer of Care Note  Patient: Stephanie Moon  Procedure(s) Performed: Procedure(s): EXCISION/RELEASE BURSA HIP (Right) ILIO-TIBIAL BAND RELEASE (Right)  Patient Location: PACU  Anesthesia Type:General  Level of Consciousness: awake, sedated and patient cooperative  Airway & Oxygen Therapy: Patient Spontanous Breathing and Patient connected to face mask oxygen  Post-op Assessment: Report given to PACU RN, Post -op Vital signs reviewed and stable and Patient moving all extremities  Post vital signs: Reviewed and stable  Complications: No apparent anesthesia complications

## 2013-12-30 NOTE — Brief Op Note (Signed)
12/30/2013  1:07 PM  PATIENT:  Stephanie Moon  48 y.o. female  PRE-OPERATIVE DIAGNOSIS:  trochanteric bursitis, tight ILIAL-TIBIAL BAND   POST-OPERATIVE DIAGNOSIS:  trochanteric bursitis, tight ILIAL-TIBIAL BAND   PROCEDURE:  Procedure(s): EXCISION/RELEASE BURSA HIP (Right) ILIO-TIBIAL BAND RELEASE (Right)  SURGEON:  Surgeon(s) and Role:    * Harvie Junior, MD - Primary  PHYSICIAN ASSISTANT:   ASSISTANTS: bethune   ANESTHESIA:   general  EBL:  Total I/O In: 2266 [P.O.:666; I.V.:1600] Out: -   BLOOD ADMINISTERED:none  DRAINS: none   LOCAL MEDICATIONS USED:  MARCAINE     SPECIMEN:  No Specimen  DISPOSITION OF SPECIMEN:  N/A  COUNTS:  YES  TOURNIQUET:  * No tourniquets in log *  DICTATION: .Other Dictation: Dictation Number [id number]  PLAN OF CARE: Discharge to home after PACU  PATIENT DISPOSITION:  PACU - hemodynamically stable.   Delay start of Pharmacological VTE agent (>24hrs) due to surgical blood loss or risk of bleeding: no

## 2013-12-31 ENCOUNTER — Encounter (HOSPITAL_BASED_OUTPATIENT_CLINIC_OR_DEPARTMENT_OTHER): Payer: Self-pay | Admitting: Orthopedic Surgery

## 2013-12-31 NOTE — Op Note (Signed)
NAMECATTLEYA, Stephanie Moon NO.:  [id number]  MEDICAL RECORD NO.:  [id number]  LOCATION:                                 FACILITY:  PHYSICIAN:  Harvie Junior, M.D.   DATE OF BIRTH:  Apr 25, 1966  DATE OF PROCEDURE:  12/30/2013 DATE OF DISCHARGE:  12/30/2013                              OPERATIVE REPORT   PREOPERATIVE DIAGNOSIS:  Trochanteric bursitis with tight iliotibial band and a prominent greater trochanter, right.  POSTOPERATIVE DIAGNOSIS:  Trochanteric bursitis with tight iliotibial band and a prominent greater trochanter, right.  PROCEDURE: 1. Iliotibial band release, right. 2. Trochanteric bursectomy, right. 3. Partial exostectomy of greater trochanter.  SURGEON:  Harvie Junior, MD  ASSISTANT:  Marshia Ly, PA  ANESTHESIA:  General.  BRIEF HISTORY:  [name] is a 48 year old female with a history of having significant complaints of right trochanteric pain.  She was seeing Dr. Doristine Section in our office, and he had done thorough and exhaustive evaluation and treatment and ultimately felt that she needed a trochanteric bursectomy and iliotibial band release and he sent her to [place] for this procedure.  We evaluated her and concurred with his workup and felt it was appropriate, she was taken to the operating room for this procedure.  DESCRIPTION OF PROCEDURE:  The patient was taken to the operating room. After adequate anesthesia was obtained with general anesthetic, the patient was placed supine on the operating table and then moved into left lateral decubitus position.  All bony prominences were well padded. She __________ held with a bean bag.  At that point, attention was turned to the right hip where an incision was made over the subcutaneous tissue down the level of the trochanter and iliotibial band was identified and about a 2-cm longitudinal rent was made and then this was __________ anteriorly and posteriorly __________ a diagonal  direction at the distal extent of the wound and at the proximal extent of the wound. Once this was done, there was a definite release of the tightness of the iliotibial band.  There was a very significant trochanteric bursa which was encountered at this point, and a thorough bursectomy was performed. The trochanteric area was identified and the rongeur was used to do a partial exostectomy of the greater trochanter.  Once this was done, the wound was copiously and thoroughly irrigated, suctioned dry, and the deep layers were closed with 0 and 2-0 Vicryl and 3-0 Monocryl subcuticular for the skin.  Benzoin and Steri-Strips were applied.  Sterile compressive dressing was applied and the patient was taken to recovery, was noted to be in satisfactory condition.  Estimated blood loss for procedure was minimal.     Harvie Junior, M.D.     Ranae Plumber  D:  12/30/2013  T:  12/31/2013  Job:  [id number]

## 2014-05-24 DIAGNOSIS — IMO0002 Reserved for concepts with insufficient information to code with codable children: Secondary | ICD-10-CM | POA: Insufficient documentation

## 2014-08-11 DIAGNOSIS — M84374A Stress fracture, right foot, initial encounter for fracture: Secondary | ICD-10-CM | POA: Insufficient documentation

## 2014-12-16 DIAGNOSIS — E559 Vitamin D deficiency, unspecified: Secondary | ICD-10-CM | POA: Insufficient documentation

## 2015-01-03 ENCOUNTER — Other Ambulatory Visit: Payer: Self-pay | Admitting: Rehabilitation

## 2015-01-03 DIAGNOSIS — M4726 Other spondylosis with radiculopathy, lumbar region: Secondary | ICD-10-CM

## 2015-01-08 ENCOUNTER — Ambulatory Visit
Admission: RE | Admit: 2015-01-08 | Discharge: 2015-01-08 | Disposition: A | Discharge status: Home / Self Care | Payer: Self-pay | Source: Ambulatory Visit | Attending: Rehabilitation | Admitting: Rehabilitation

## 2015-01-08 DIAGNOSIS — M4726 Other spondylosis with radiculopathy, lumbar region: Secondary | ICD-10-CM

## 2015-02-01 DIAGNOSIS — I10 Essential (primary) hypertension: Secondary | ICD-10-CM | POA: Insufficient documentation

## 2015-02-01 DIAGNOSIS — R55 Syncope and collapse: Secondary | ICD-10-CM | POA: Insufficient documentation

## 2015-02-01 DIAGNOSIS — E669 Obesity, unspecified: Secondary | ICD-10-CM | POA: Insufficient documentation

## 2015-02-01 DIAGNOSIS — R600 Localized edema: Secondary | ICD-10-CM | POA: Insufficient documentation

## 2015-09-30 ENCOUNTER — Emergency Department (HOSPITAL_BASED_OUTPATIENT_CLINIC_OR_DEPARTMENT_OTHER): Payer: BLUE CROSS/BLUE SHIELD

## 2015-09-30 ENCOUNTER — Encounter (HOSPITAL_BASED_OUTPATIENT_CLINIC_OR_DEPARTMENT_OTHER): Payer: Self-pay | Admitting: *Deleted

## 2015-09-30 ENCOUNTER — Emergency Department (HOSPITAL_BASED_OUTPATIENT_CLINIC_OR_DEPARTMENT_OTHER)
Admission: EM | Admit: 2015-09-30 | Discharge: 2015-09-30 | Disposition: A | Discharge status: Home / Self Care | Payer: BLUE CROSS/BLUE SHIELD | Attending: Emergency Medicine | Admitting: Emergency Medicine

## 2015-09-30 DIAGNOSIS — Z973 Presence of spectacles and contact lenses: Secondary | ICD-10-CM | POA: Insufficient documentation

## 2015-09-30 DIAGNOSIS — F329 Major depressive disorder, single episode, unspecified: Secondary | ICD-10-CM | POA: Insufficient documentation

## 2015-09-30 DIAGNOSIS — W2209XA Striking against other stationary object, initial encounter: Secondary | ICD-10-CM | POA: Insufficient documentation

## 2015-09-30 DIAGNOSIS — Z880 Allergy status to penicillin: Secondary | ICD-10-CM | POA: Diagnosis not present

## 2015-09-30 DIAGNOSIS — Y998 Other external cause status: Secondary | ICD-10-CM | POA: Diagnosis not present

## 2015-09-30 DIAGNOSIS — S6992XA Unspecified injury of left wrist, hand and finger(s), initial encounter: Secondary | ICD-10-CM | POA: Diagnosis not present

## 2015-09-30 DIAGNOSIS — Y9389 Activity, other specified: Secondary | ICD-10-CM | POA: Insufficient documentation

## 2015-09-30 DIAGNOSIS — Z87891 Personal history of nicotine dependence: Secondary | ICD-10-CM | POA: Insufficient documentation

## 2015-09-30 DIAGNOSIS — K219 Gastro-esophageal reflux disease without esophagitis: Secondary | ICD-10-CM | POA: Insufficient documentation

## 2015-09-30 DIAGNOSIS — Y9289 Other specified places as the place of occurrence of the external cause: Secondary | ICD-10-CM | POA: Insufficient documentation

## 2015-09-30 DIAGNOSIS — I10 Essential (primary) hypertension: Secondary | ICD-10-CM | POA: Insufficient documentation

## 2015-09-30 DIAGNOSIS — Z79899 Other long term (current) drug therapy: Secondary | ICD-10-CM | POA: Diagnosis not present

## 2015-09-30 NOTE — ED Notes (Signed)
Patient transported to X-ray 

## 2015-09-30 NOTE — ED Notes (Signed)
Pt seen by PA but stated was not satisfied with her care and requested to leave. Pt agreed to sign out as a discharge but refused to wait for discharge papers.

## 2015-09-30 NOTE — ED Notes (Signed)
Pt reports her left hand was slammed against door frame while holding dog leash 1 week ago. Reports continued pain and swelling

## 2015-10-01 NOTE — ED Provider Notes (Signed)
CSN: [id number]     Arrival date & time 09/30/15  1915 History   First MD Initiated Contact with Patient 09/30/15 2048     Chief Complaint  Patient presents with  . Hand Injury     (Consider location/radiation/quality/duration/timing/severity/associated sxs/prior Treatment) HPI   Patient is a 50 year old female past medical history of hypertension, depression and GERD since the ED with complaint of left hand pain. Patient reports her left hand was slammed against the doorframe while holding her dog leash 1 week ago. She reports initially having severe pain and swelling to her left hand but notes the swelling has improved over the past week. Patient reports having continued pain in her left hand but notes it has mildly improved since initial incident. Patient reports she has been using ice at home but denies taking any medications. Denies numbness, tingling, weakness. Patient denies any other recent injury or trauma.  Past Medical History  Diagnosis Date  . Hypertension   . GERD (gastroesophageal reflux disease)   . Depression   . Contact lens/glasses fitting     wears contacts or glasses  . Complication of anesthesia     slow to wake up-o2 sat drop   Past Surgical History  Procedure Laterality Date  . Appendectomy    . Shoulder arthroscopy w/ rotator cuff repair  2010    right  . Knee arthroscopy  2009    right  . Foot neuroma surgery    . Orif wrist fracture  2001,2002    left  . Colonoscopy    . Wisdom tooth extraction    . Excision/release bursa hip Right 12/30/2013    Procedure: EXCISION/RELEASE BURSA HIP;  Surgeon: Harvie JuniorJohn L Graves, MD;  Location: [facility];  Service: Orthopedics;  Laterality: Right;  . Iliotibial band release Right 12/30/2013    Procedure: ILIO-TIBIAL BAND RELEASE;  Surgeon: Harvie JuniorJohn L Graves, MD;  Location:  SURGERY CENTER;  Service: Orthopedics;  Laterality: Right;   No family history on file. Social History  Substance Use Topics  .  Smoking status: Former Smoker    Quit date: 07/30/2005  . Smokeless tobacco: None  . Alcohol Use: Yes   OB History    No data available     Review of Systems  Constitutional: Negative for fever.  Musculoskeletal: Positive for joint swelling and arthralgias.  Skin: Negative for wound.  Neurological: Negative for weakness and numbness.      Allergies  Penicillins and Sulfa antibiotics  Home Medications   Prior to Admission medications   Medication Sig Start Date End Date Taking? Authorizing Provider  AMLODIPINE BESYLATE PO Take by mouth.   Yes Historical Provider, MD  HYDROCHLOROTHIAZIDE PO Take by mouth.   Yes Historical Provider, MD  losartan (COZAAR) 100 MG tablet Take 100 mg by mouth daily.   Yes Historical Provider, MD  omeprazole (PRILOSEC) 20 MG capsule Take 20 mg by mouth daily.   Yes Historical Provider, MD  citalopram (CELEXA) 20 MG tablet Take 20 mg by mouth daily.    Historical Provider, MD  oxyCODONE-acetaminophen (PERCOCET/ROXICET) 5-325 MG per tablet Take 1-2 tablets by mouth every 6 (six) hours as needed for severe pain. 12/30/13   Marshia LyJames Bethune, PA-C  SUMAtriptan (IMITREX) 100 MG tablet Take 100 mg by mouth every 2 (two) hours as needed. For migraines    Historical Provider, MD   BP 119/94 mmHg  Pulse 73  Temp(Src) 98.5 F (36.9 C) (Oral)  Resp 16  Ht 5\' 7"  (1.702  m)  Wt 90.719 kg  BMI 31.32 kg/m2  SpO2 99%  LMP 09/27/2015 Physical Exam  Constitutional: She is oriented to person, place, and time. She appears well-developed and well-nourished.  HENT:  Head: Normocephalic and atraumatic.  Eyes: Conjunctivae and EOM are normal. Right eye exhibits no discharge. Left eye exhibits no discharge. No scleral icterus.  Neck: Normal range of motion. Neck supple.  Cardiovascular: Normal rate.   Pulmonary/Chest: Effort normal.  Musculoskeletal: Normal range of motion. She exhibits tenderness. She exhibits no edema.       Left hand: She exhibits tenderness. She  exhibits normal range of motion, normal two-point discrimination, normal capillary refill, no deformity, no laceration and no swelling. Normal sensation noted. Normal strength noted.  Tenderness to palpation over left second and third MCP joints, full range of motion of left hand, wrist and elbow. 2+ radial pulses. Sensation intact. Cap refill less than 2. Equal grip strength bilaterally. No swelling, erythema or warmth noted.  Neurological: She is alert and oriented to person, place, and time.  Nursing note and vitals reviewed.   ED Course  Procedures (including critical care time) Labs Review Labs Reviewed - No data to display  Imaging Review Dg Hand Complete Left  09/30/2015  CLINICAL DATA:  LEFT hand injury 6 days ago, posterior surface of hand hit door frame, pain throughout second distal metacarpal and MCP joint, pain when making a fist, bruising and swelling EXAM: LEFT HAND - COMPLETE 3+ VIEW COMPARISON:  None FINDINGS: Osseous mineralization normal. Herbert screw at scaphoid post ORIF. Minimal degenerative changes at STT joint. No acute fracture, dislocation or bone destruction. Dorsal soft tissue swelling. IMPRESSION: Prior scaphoid ORIF. No acute osseous abnormalities. Electronically Signed   By: Ulyses Southward M.D.   On: 09/30/2015 19:47   I have personally reviewed and evaluated these images and lab results as part of my medical decision-making.   EKG Interpretation None      MDM   Final diagnoses:  Hand injury, left, initial encounter    Patient presents with left hand injury. She reports hitting her hand against the doorframe one week ago. VSS. Exam revealed mild tenderness over left second and third MCP joints, remaining exam unremarkable. Left hand neurovascularly intact. Left hand revealed no acute abnormalities. Plan to discharge patient home with symptomatic treatment.  I was notified by nurse that patient reports she was not satisfied with her care and requested to  leave. Nurse reports patient agreed to sign out at discharge but refused to wait for her discharge papers.     Satira Sark [place], [place] 10/01/15 1610  Alvira Monday, MD 10/01/15 1229

## 2016-07-31 ENCOUNTER — Encounter: Payer: Self-pay | Admitting: Podiatry

## 2016-07-31 ENCOUNTER — Ambulatory Visit (INDEPENDENT_AMBULATORY_CARE_PROVIDER_SITE_OTHER): Payer: Managed Care, Other (non HMO)

## 2016-07-31 ENCOUNTER — Encounter: Payer: Self-pay | Admitting: *Deleted

## 2016-07-31 ENCOUNTER — Other Ambulatory Visit: Payer: Self-pay | Admitting: *Deleted

## 2016-07-31 ENCOUNTER — Ambulatory Visit (INDEPENDENT_AMBULATORY_CARE_PROVIDER_SITE_OTHER): Payer: Managed Care, Other (non HMO) | Admitting: Podiatry

## 2016-07-31 VITALS — BP 106/69 | HR 70 | Resp 16

## 2016-07-31 DIAGNOSIS — M722 Plantar fascial fibromatosis: Secondary | ICD-10-CM

## 2016-07-31 MED ORDER — METHYLPREDNISOLONE 4 MG PO TBPK: ORAL_TABLET | ORAL | 0 refills | Status: AC

## 2016-07-31 MED ORDER — MELOXICAM 15 MG PO TABS: 15.0000 mg | ORAL_TABLET | Freq: Every day | ORAL | 3 refills | Status: DC

## 2016-07-31 NOTE — Progress Notes (Signed)
   Subjective:    Patient ID: Stephanie Moon, female    DOB: 02/13/1966, 51 y.o.   MRN: [id number]  HPI: She presents today with chief complaint of plantar heel pain bilaterally. She states that the left hurts worse than the right and has been aching for the past 2 months. Mornings are particularly bad. She works at [employer] where she is on her feet all the time. She's had this problem before. She currently wears a night splint and tried ice massage therapy over-the-counter orthotics shots seem to have helped the most in the past.    Review of Systems  Musculoskeletal: Positive for arthralgias.  All other systems reviewed and are negative.      Objective:   Physical Exam: She presents today vital signs stable alert and oriented 3. Pulses are strongly palpable. Neurologic sensorium is intact each and reflexes are intact muscle strength is normal bilateral. Orthopedic evaluation demonstrates pain on palpation medial calcaneal tubercle at bilateral. Otherwise I'll joint cyst to the ankle range of motion without crepitation. Radiographic evaluation demonstrates plantar distally oriented calcaneal heel spur right greater than left with soft tissue increase in density of the plantar fascia calcaneal insertion site left foot and right. No open lesions or wounds are noted.          Assessment & Plan:  Assessment: Plantar fasciitis left greater than right.  Plan: Start her on a Medrol Dosepak to be followed by meloxicam. Injected the bilateral heels today placed her plantar fascia braces. She will use a night splint that she already has at home. We discussed appropriate shoe gear stretching exercises ice therapy shear modifications and the need for orthotics.

## 2016-07-31 NOTE — Patient Instructions (Signed)

## 2016-08-28 ENCOUNTER — Ambulatory Visit: Payer: Managed Care, Other (non HMO) | Admitting: Podiatry

## 2016-09-10 IMAGING — CR DG HAND COMPLETE 3+V*L*
3 series · 3 of 3 positions shown · non-contrast
Comparison: None

CLINICAL DATA: LEFT hand injury 6 days ago, posterior surface of
hand hit door frame, pain throughout second distal metacarpal and
MCP joint, pain when making a fist, bruising and swelling

EXAM:
LEFT HAND - COMPLETE 3+ VIEW

[x hand pa left]
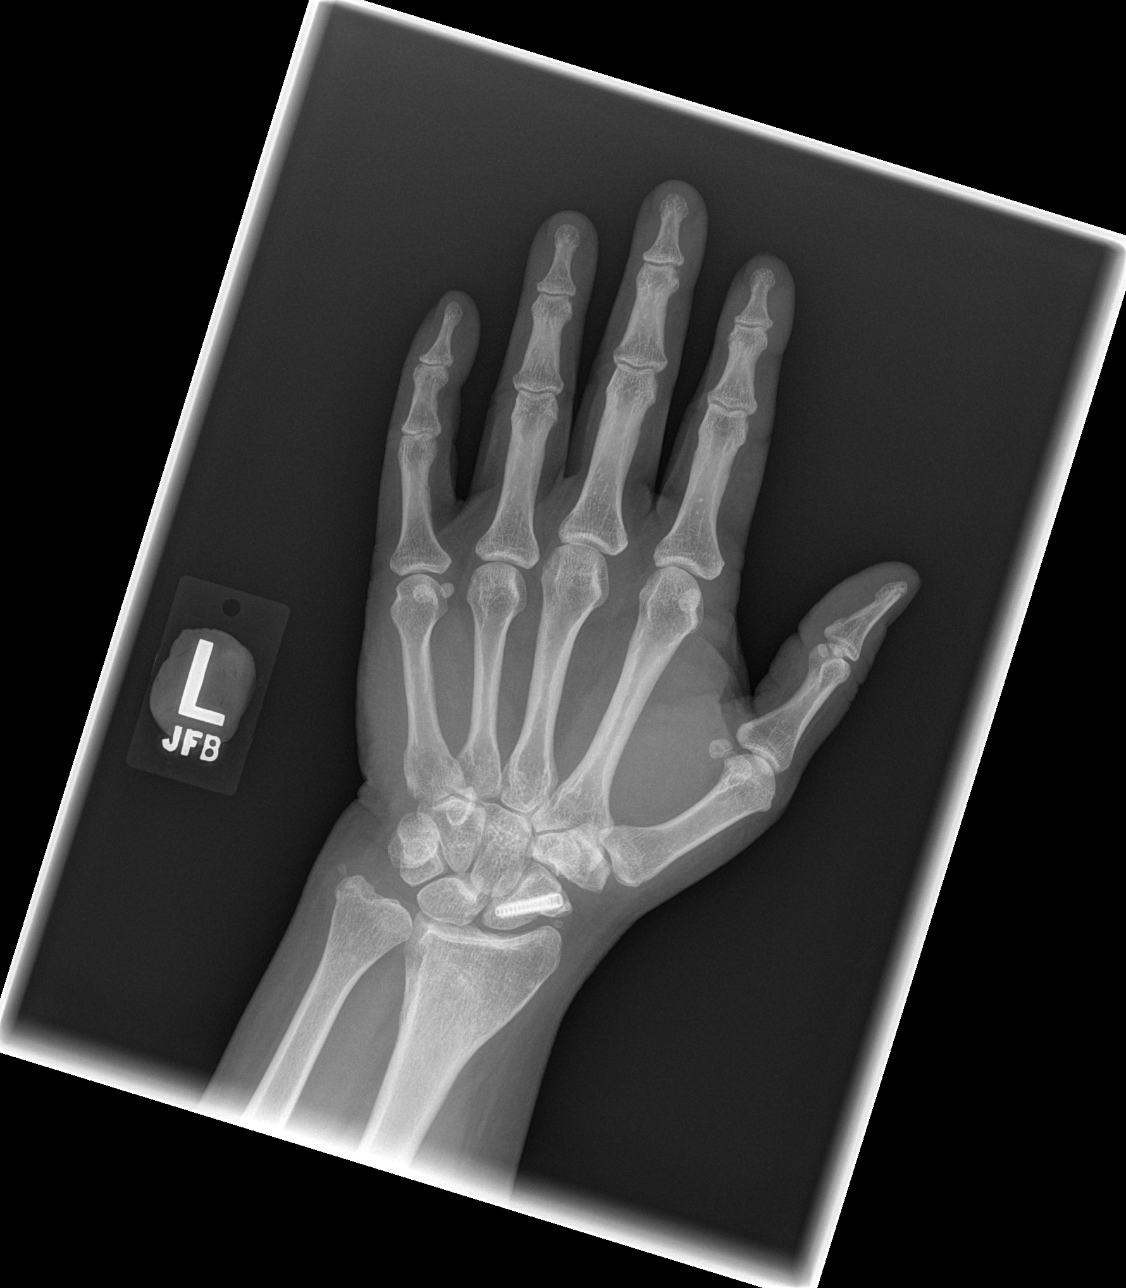

[x hand lat left]
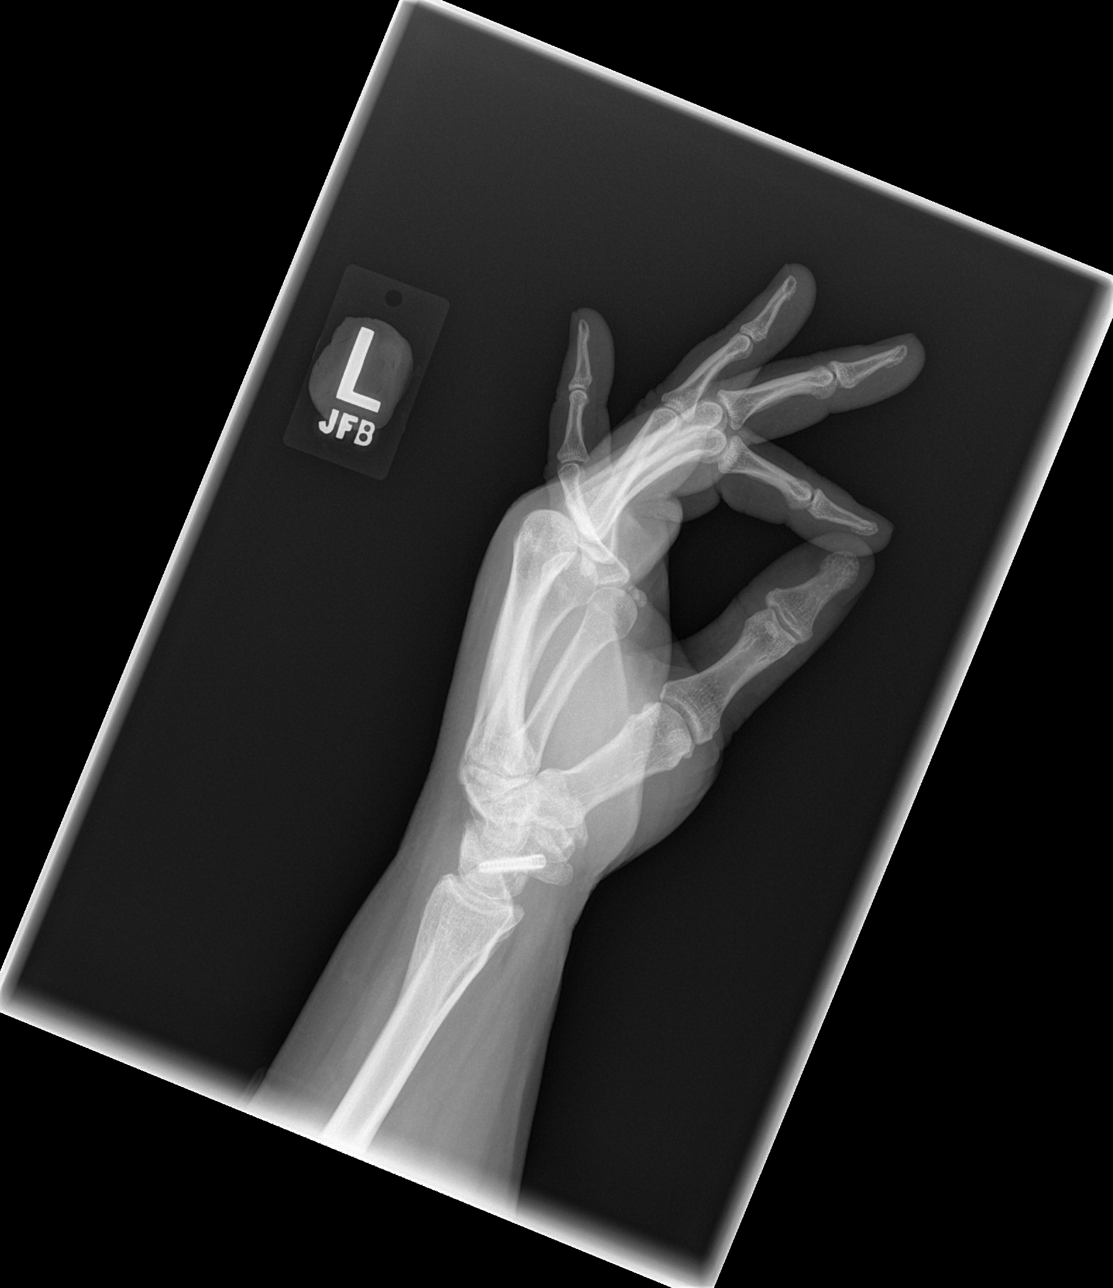

[x hand oblique left]
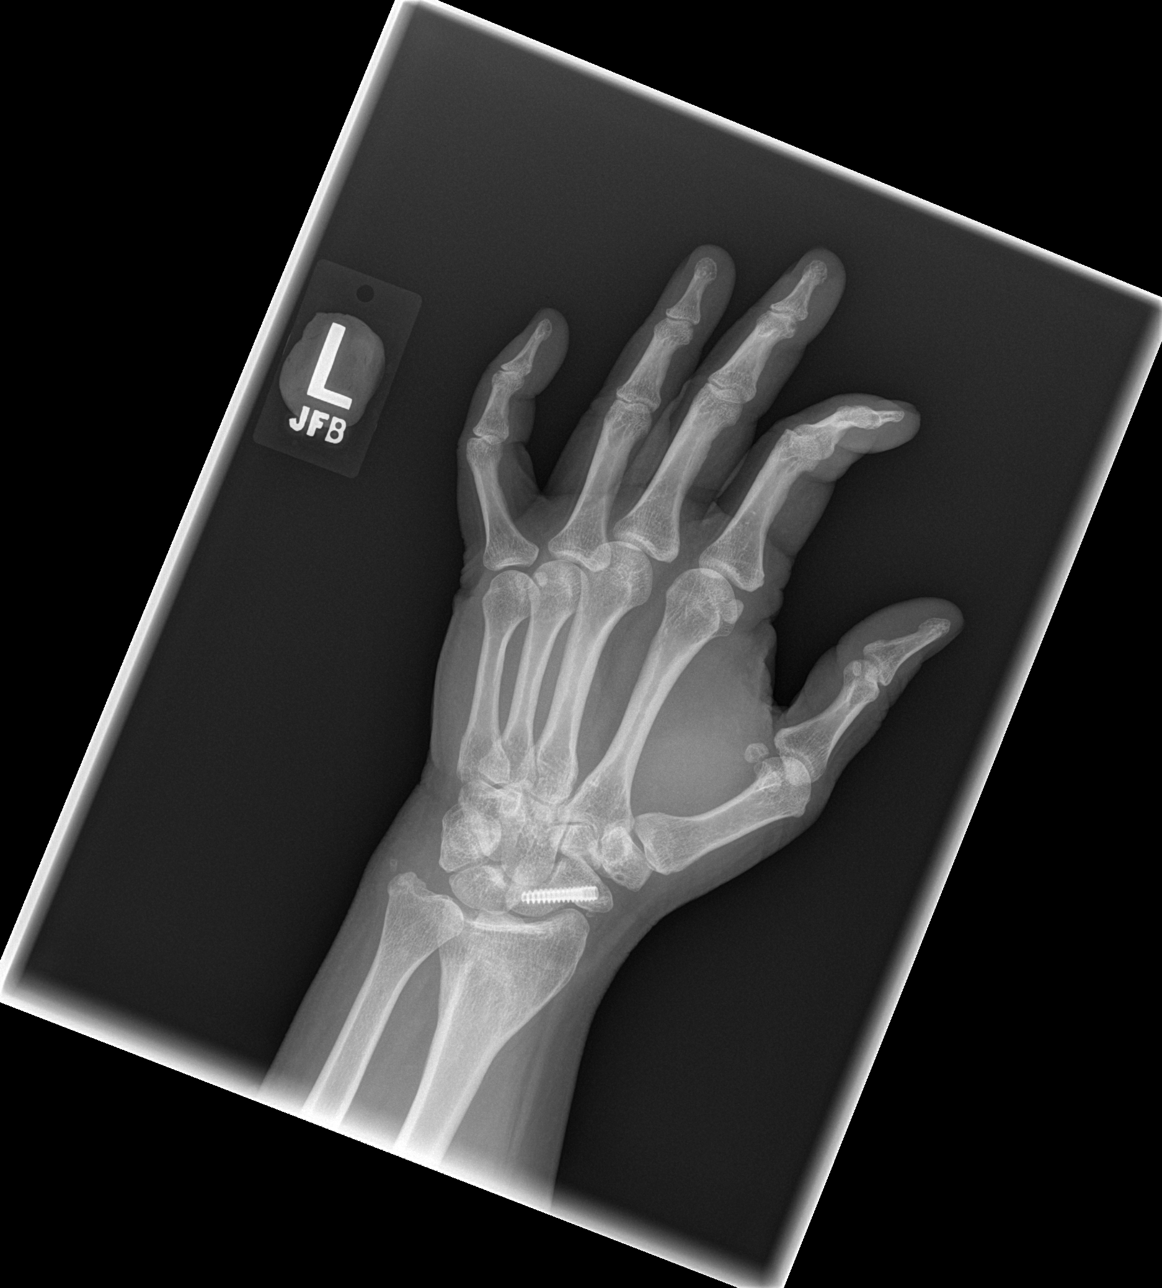

[3 of 3 positions shown; findings below may reference images not displayed]

FINDINGS: Osseous mineralization normal.

Magdaleno screw at scaphoid post ORIF.

Minimal degenerative changes at STT joint.

No acute fracture, dislocation or bone destruction.

Dorsal soft tissue swelling.
IMPRESSION: Prior scaphoid ORIF.

No acute osseous abnormalities.

## 2016-11-16 ENCOUNTER — Telehealth: Payer: Self-pay | Admitting: *Deleted

## 2016-11-16 MED ORDER — MELOXICAM 15 MG PO TABS: 15.0000 mg | ORAL_TABLET | Freq: Every day | ORAL | 1 refills | Status: DC

## 2016-11-16 NOTE — Telephone Encounter (Signed)
Received request for meloxicam for 90 days. Dr. Al Corpus okayed +1 refill.

## 2017-05-24 ENCOUNTER — Telehealth: Payer: Self-pay | Admitting: *Deleted

## 2017-05-24 MED ORDER — MELOXICAM 15 MG PO TABS: 15.0000 mg | ORAL_TABLET | Freq: Every day | ORAL | 1 refills | Status: AC

## 2017-05-24 NOTE — Telephone Encounter (Signed)
Refill request for meloxicam. Dr. Al CorpusHyatt ordered refill once + 1additional.
